# Patient Record
Sex: Male | Born: 1990 | Race: Black or African American | Hispanic: No | Marital: Single | Smoking: Former smoker
Health system: Southern US, Community
[De-identification: ages and names within clinical notes are randomized; demographics above are authoritative.]

## PROBLEM LIST (undated history)

## (undated) DIAGNOSIS — J45909 Unspecified asthma, uncomplicated: Secondary | ICD-10-CM

---
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
## 2012-11-22 ENCOUNTER — Encounter (HOSPITAL_BASED_OUTPATIENT_CLINIC_OR_DEPARTMENT_OTHER): Payer: Self-pay

## 2012-11-22 ENCOUNTER — Emergency Department (HOSPITAL_BASED_OUTPATIENT_CLINIC_OR_DEPARTMENT_OTHER)
Admission: EM | Admit: 2012-11-22 | Discharge: 2012-11-22 | Disposition: A | Discharge status: Home / Self Care | Payer: Managed Care, Other (non HMO) | Attending: Emergency Medicine | Admitting: Emergency Medicine

## 2012-11-22 DIAGNOSIS — L299 Pruritus, unspecified: Secondary | ICD-10-CM | POA: Insufficient documentation

## 2012-11-22 DIAGNOSIS — R209 Unspecified disturbances of skin sensation: Secondary | ICD-10-CM | POA: Insufficient documentation

## 2012-11-22 DIAGNOSIS — IMO0002 Reserved for concepts with insufficient information to code with codable children: Secondary | ICD-10-CM | POA: Insufficient documentation

## 2012-11-22 DIAGNOSIS — T7840XA Allergy, unspecified, initial encounter: Secondary | ICD-10-CM

## 2012-11-22 DIAGNOSIS — R21 Rash and other nonspecific skin eruption: Secondary | ICD-10-CM | POA: Insufficient documentation

## 2012-11-22 MED ORDER — PREDNISONE 20 MG PO TABS: 40.0000 mg | ORAL_TABLET | Freq: Every day | ORAL | Status: DC

## 2012-11-22 NOTE — ED Notes (Signed)
Pt reports lip swelling that started yesterday. Denies difficulty breathing or swallowing.

## 2012-11-22 NOTE — ED Provider Notes (Signed)
   History    CSN: [id number] Arrival date & time 11/22/12  1153  First MD Initiated Contact with Patient 11/22/12 1218     Chief Complaint  Patient presents with  . Facial Swelling   (Consider location/radiation/quality/duration/timing/severity/associated sxs/prior Treatment) HPI Comments: States does not feel like he came in contact with anything new but lips have been swollen and tingling the last 2 days.  No mouth or tongue involvement.  Mild itching and tingling on the face as well.  Only happened once in the past and unclear what caused it.  Improvement in lip swelling but still having reaction.  Patient is a 22 y.o. male presenting with rash. The history is provided by the patient.  Rash Pain location: lips and face. Pain quality comment:  Swelling of both lips and itching and itching in the face Associated symptoms: no shortness of breath    History reviewed. No pertinent past medical history. History reviewed. No pertinent past surgical history. No family history on file. History  Substance Use Topics  . Smoking status: Never Smoker   . Smokeless tobacco: Not on file  . Alcohol Use: No    Review of Systems  HENT: Negative for trouble swallowing.   Respiratory: Negative for shortness of breath and wheezing.   Skin: Positive for rash.  All other systems reviewed and are negative.    Allergies  Rubbing alcohol  Home Medications   Current Outpatient Rx  Name  Route  Sig  Dispense  Refill  . predniSONE (DELTASONE) 20 MG tablet   Oral   Take 2 tablets (40 mg total) by mouth daily.   10 tablet   0    BP 138/72  Pulse 64  Temp(Src) 98.4 F (36.9 C) (Oral)  Resp 18  Wt 230 lb (104.327 kg)  BMI 33 kg/m2  SpO2 100% Physical Exam  Nursing note and vitals reviewed. Constitutional: He is oriented to person, place, and time. He appears well-developed and well-nourished. No distress.  HENT:  Head: Normocephalic and atraumatic.  Mild swelling and irritation to  the bilateral lips.  Tongue and uvula is normal  Eyes: EOM are normal. Pupils are equal, round, and reactive to light.  Cardiovascular: Normal rate.   Pulmonary/Chest: Effort normal.  Neurological: He is alert and oriented to person, place, and time.  Skin: Skin is warm and dry. Rash noted. Rash is maculopapular.     Psychiatric: He has a normal mood and affect. His behavior is normal.    ED Course  Procedures (including critical care time) Labs Reviewed - No data to display No results found. 1. Allergic reaction, initial encounter     MDM    Pt with signs of allergic reaction to lips.  No intraoral swelling, no throat swelling and no SOB.  Pt given steroids  Gwyneth Sprout, MD 11/22/12 1319

## 2012-11-22 NOTE — ED Notes (Signed)
MD at bedside. 

## 2012-11-30 ENCOUNTER — Encounter (HOSPITAL_BASED_OUTPATIENT_CLINIC_OR_DEPARTMENT_OTHER): Payer: Self-pay | Admitting: Family Medicine

## 2012-11-30 ENCOUNTER — Emergency Department (HOSPITAL_BASED_OUTPATIENT_CLINIC_OR_DEPARTMENT_OTHER)
Admission: EM | Admit: 2012-11-30 | Discharge: 2012-11-30 | Disposition: A | Discharge status: Home / Self Care | Payer: Managed Care, Other (non HMO) | Attending: Emergency Medicine | Admitting: Emergency Medicine

## 2012-11-30 ENCOUNTER — Emergency Department (HOSPITAL_BASED_OUTPATIENT_CLINIC_OR_DEPARTMENT_OTHER): Payer: Managed Care, Other (non HMO)

## 2012-11-30 DIAGNOSIS — Y9367 Activity, basketball: Secondary | ICD-10-CM | POA: Insufficient documentation

## 2012-11-30 DIAGNOSIS — S022XXA Fracture of nasal bones, initial encounter for closed fracture: Secondary | ICD-10-CM

## 2012-11-30 DIAGNOSIS — W219XXA Striking against or struck by unspecified sports equipment, initial encounter: Secondary | ICD-10-CM | POA: Insufficient documentation

## 2012-11-30 DIAGNOSIS — Y9289 Other specified places as the place of occurrence of the external cause: Secondary | ICD-10-CM | POA: Insufficient documentation

## 2012-11-30 MED ORDER — HYDROCODONE-ACETAMINOPHEN 5-325 MG PO TABS: 2.0000 | ORAL_TABLET | ORAL | Status: DC | PRN

## 2012-11-30 NOTE — ED Provider Notes (Signed)
   History    CSN: [id number] Arrival date & time 11/30/12  1436  First MD Initiated Contact with Patient 11/30/12 1448     Chief Complaint  Patient presents with  . Facial Injury   (Consider location/radiation/quality/duration/timing/severity/associated sxs/prior Treatment) HPI Comments: Pt states that he was elbowed while playing basketball:pt states that his nose was bleeding  Patient is a 22 y.o. male presenting with facial injury. The history is provided by the patient. No language interpreter was used.  Facial Injury Mechanism of injury:  Direct blow Location:  Nose Pain details:    Quality:  Aching   Severity:  Mild   Timing:  Constant   Progression:  Unchanged  History reviewed. No pertinent past medical history. History reviewed. No pertinent past surgical history. No family history on file. History  Substance Use Topics  . Smoking status: Never Smoker   . Smokeless tobacco: Not on file  . Alcohol Use: No    Review of Systems  Constitutional: Negative.   Respiratory: Negative.   Cardiovascular: Negative.     Allergies  Rubbing alcohol  Home Medications   Current Outpatient Rx  Name  Route  Sig  Dispense  Refill  . HYDROcodone-acetaminophen (NORCO/VICODIN) 5-325 MG per tablet   Oral   Take 2 tablets by mouth every 4 (four) hours as needed for pain.   10 tablet   0   . predniSONE (DELTASONE) 20 MG tablet   Oral   Take 2 tablets (40 mg total) by mouth daily.   10 tablet   0    BP 154/74  Pulse 62  Temp(Src) 98 F (36.7 C) (Oral)  Resp 16  SpO2 99% Physical Exam  Nursing note and vitals reviewed. Constitutional: He appears well-developed and well-nourished.  HENT:  Right Ear: External ear normal.  Left Ear: External ear normal.  Swelling noted over the bridge of the nose:dried blood noted in nares bilaterally  Eyes: Conjunctivae and EOM are normal. Pupils are equal, round, and reactive to light.  Cardiovascular: Normal rate and regular  rhythm.   Pulmonary/Chest: Effort normal and breath sounds normal.  Musculoskeletal: Normal range of motion.    ED Course  Procedures (including critical care time) Labs Reviewed - No data to display Dg Nasal Bones  11/30/2012   *RADIOLOGY REPORT*  Clinical Data: Hit in the nose with elbow, pain and bleeding  NASAL BONES - 3+ VIEW  Comparison: None.  Findings: There is a minimally displaced fracture of the tip of the distal nasal bone.  No other acute bony abnormality is seen.  The paranasal sinuses are clear.  The nasal spine is intact.  IMPRESSION: Minimally displaced fracture of the tip of the nasal bone.   Original Report Authenticated By: Dwyane Dee, M.D.   1. Nasal fracture, closed, initial encounter     MDM  Pt given vicodin and referral to ent  Teressa Lower, NP 11/30/12 1532

## 2012-11-30 NOTE — ED Notes (Signed)
Pt sts he got hit in nose with elbow while playing basketball. Dried blood on nares. Denies loc

## 2012-11-30 NOTE — ED Notes (Signed)
Patient transported to X-ray 

## 2012-12-01 NOTE — ED Provider Notes (Signed)
Medical screening examination/treatment/procedure(s) were performed by non-physician practitioner and as supervising physician I was immediately available for consultation/collaboration.   Kapono Luhn B. Saria Haran, MD 12/01/12 0832 

## 2017-06-20 ENCOUNTER — Other Ambulatory Visit: Payer: Self-pay

## 2017-06-20 ENCOUNTER — Emergency Department (HOSPITAL_BASED_OUTPATIENT_CLINIC_OR_DEPARTMENT_OTHER)
Admission: EM | Admit: 2017-06-20 | Discharge: 2017-06-20 | Disposition: A | Discharge status: Home / Self Care | Payer: BLUE CROSS/BLUE SHIELD | Attending: Emergency Medicine | Admitting: Emergency Medicine

## 2017-06-20 ENCOUNTER — Encounter (HOSPITAL_BASED_OUTPATIENT_CLINIC_OR_DEPARTMENT_OTHER): Payer: Self-pay | Admitting: *Deleted

## 2017-06-20 DIAGNOSIS — L03011 Cellulitis of right finger: Secondary | ICD-10-CM | POA: Diagnosis not present

## 2017-06-20 DIAGNOSIS — Y9389 Activity, other specified: Secondary | ICD-10-CM | POA: Insufficient documentation

## 2017-06-20 DIAGNOSIS — F17210 Nicotine dependence, cigarettes, uncomplicated: Secondary | ICD-10-CM | POA: Insufficient documentation

## 2017-06-20 DIAGNOSIS — S60561A Insect bite (nonvenomous) of right hand, initial encounter: Secondary | ICD-10-CM | POA: Insufficient documentation

## 2017-06-20 DIAGNOSIS — Y929 Unspecified place or not applicable: Secondary | ICD-10-CM | POA: Insufficient documentation

## 2017-06-20 DIAGNOSIS — Y999 Unspecified external cause status: Secondary | ICD-10-CM | POA: Diagnosis not present

## 2017-06-20 DIAGNOSIS — Z79899 Other long term (current) drug therapy: Secondary | ICD-10-CM | POA: Diagnosis not present

## 2017-06-20 DIAGNOSIS — W57XXXA Bitten or stung by nonvenomous insect and other nonvenomous arthropods, initial encounter: Secondary | ICD-10-CM | POA: Diagnosis not present

## 2017-06-20 DIAGNOSIS — R2231 Localized swelling, mass and lump, right upper limb: Secondary | ICD-10-CM | POA: Diagnosis present

## 2017-06-20 MED ORDER — DOXYCYCLINE HYCLATE 100 MG PO TABS
100.0000 mg | ORAL_TABLET | Freq: Once | ORAL | Status: AC
  Administered 2017-06-20: 100 mg via ORAL
  Filled 2017-06-20: qty 1

## 2017-06-20 MED ORDER — DOXYCYCLINE HYCLATE 100 MG PO CAPS: 100.0000 mg | ORAL_CAPSULE | Freq: Two times a day (BID) | ORAL | 0 refills | Status: DC

## 2017-06-20 NOTE — ED Provider Notes (Signed)
[facility] EMERGENCY DEPARTMENT Provider Note   CSN: [id number] Arrival date & time: 06/20/17  0405     History   Chief Complaint Chief Complaint  Patient presents with  . Insect Bite    HPI Alex Herring is a 27 y.o. male.  HPI  This is a 27 year old male who presents with concerns for insect bites.  Patient reports 2-3-day history of noting multiple "insect bites" on his neck, hand, and bilateral legs.  Initially he noted itching but then noted increasing redness and swelling as well as pain to the right hand.  He states that he tried to lance the bite open of the right hand and has noted some drainage.  He denies any fevers.  Unsure of what he may have been bitten by.  Denies any known exposures.  No one in his house has similar symptoms.  He did take Benadryl prior to arrival.  History reviewed. No pertinent past medical history.  There are no active problems to display for this patient.   History reviewed. No pertinent surgical history.     Home Medications    Prior to Admission medications   Medication Sig Start Date End Date Taking? Authorizing Provider  doxycycline (VIBRAMYCIN) 100 MG capsule Take 1 capsule (100 mg total) by mouth 2 (two) times daily. 06/20/17   Veronia Laprise, Mayer Maskerourtney F, MD  HYDROcodone-acetaminophen (NORCO/VICODIN) 5-325 MG per tablet Take 2 tablets by mouth every 4 (four) hours as needed for pain. 11/30/12   Teressa LowerPickering, Vrinda, NP  predniSONE (DELTASONE) 20 MG tablet Take 2 tablets (40 mg total) by mouth daily. 11/22/12   Gwyneth SproutPlunkett, Whitney, MD    Family History History reviewed. No pertinent family history.  Social History Social History   Tobacco Use  . Smoking status: Current Some Day Smoker  . Smokeless tobacco: Never Used  Substance Use Topics  . Alcohol use: Yes  . Drug use: No     Allergies   Rubbing alcohol [alcohol]   Review of Systems Review of Systems  Constitutional: Negative for fever.  Skin: Positive for color  change and wound.  All other systems reviewed and are negative.    Physical Exam Updated Vital Signs BP 131/70 (BP Location: Left Arm)   Pulse 60   Temp 98.1 F (36.7 C)   Resp 16   Ht 5\' 11"  (1.803 m)   Wt 104.3 kg (230 lb)   SpO2 100%   BMI 32.08 kg/m   Physical Exam  Constitutional: He is oriented to person, place, and time. He appears well-developed and well-nourished. No distress.  HENT:  Head: Normocephalic and atraumatic.  Cardiovascular: Normal rate, regular rhythm and normal heart sounds.  Pulmonary/Chest: Effort normal and breath sounds normal. No respiratory distress. He has no wheezes.  Musculoskeletal: He exhibits no edema.  Mild swelling noted of the dorsum of the right hand along the ulnar aspect, normal range of motion of the wrist, flexion and extension of the fingers, 2+ radial pulse  Neurological: He is alert and oriented to person, place, and time.  Skin: Skin is warm and dry.  2 discrete scabbed areas over the back of the neck with overlying excoriation, no significant erythema, no fluctuance Lesion noted over the fifth MCP of the right hand with associated swelling and streaking erythema proximally, no spontaneous drainage noted Less than 1 cm blister noted to the medial aspect of the left ankle with mild erythema  Psychiatric: He has a normal mood and affect.  Nursing note and vitals  reviewed.        ED Treatments / Results  Labs (all labs ordered are listed, but only abnormal results are displayed) Labs Reviewed - No data to display  EKG  EKG Interpretation None       Radiology No results found.  Procedures Procedures (including critical care time)  Medications Ordered in ED Medications  doxycycline (VIBRA-TABS) tablet 100 mg (not administered)     Initial Impression / Assessment and Plan / ED Course  I have reviewed the triage vital signs and the nursing notes.  Pertinent labs & imaging results that were available during my  care of the patient were reviewed by me and considered in my medical decision making (see chart for details).     Patient presents with multiple discrete lesions concerning for insect bites.  He has evidence of cellulitis and streaking of the right hand.  Likely related to lancing of that particular lesion.  But he is clinically well-appearing.  No fevers.  No evidence of deep space infection and normal range of motion of the fingers and wrists.  Will treat with doxycycline to cover for MRSA.  Local wound management with the other bites.  Patient was given strict return precautions including increasing redness, fevers, increasing swelling.  After history, exam, and medical workup I feel the patient has been appropriately medically screened and is safe for discharge home. Pertinent diagnoses were discussed with the patient. Patient was given return precautions.   Final Clinical Impressions(s) / ED Diagnoses   Final diagnoses:  Insect bite, initial encounter  Cellulitis of finger of right hand    ED Discharge Orders        Ordered    doxycycline (VIBRAMYCIN) 100 MG capsule  2 times daily     06/20/17 0450       Valen Gillison, Mayer Masker, MD 06/20/17 [phone]

## 2017-06-20 NOTE — ED Notes (Addendum)
Alert, NAD, calm, interactive, resps e/u, speaking in clear complete sentences, no dyspnea noted, skin W&D, VSS, here for "insect bites" to R hand, L ankle and L posterior neck with: redness and swelling, (and blister noted to L ankle), admits to pain and itching, admits to lancing R hand, took benadryl PTA, no topicals applied, (denies: sob, NVD, diarrhea, cramping, fever, dizziness or visual changes). EDP into room.

## 2017-06-20 NOTE — Discharge Instructions (Signed)
You were seen today for possible insect bites and increasing redness of the right hand.  You have an infection of the skin of the right hand.  Take antibiotics as directed.  If you notice increasing redness, swelling, difficulty with range of motion of the fingers, you need to be reevaluated immediately.  Regarding her other bites, wound care with bacitracin is recommended.  Try not to scratch.

## 2019-09-21 ENCOUNTER — Ambulatory Visit: Payer: Self-pay | Attending: Family

## 2019-09-21 DIAGNOSIS — Z23 Encounter for immunization: Secondary | ICD-10-CM

## 2019-09-21 NOTE — Progress Notes (Signed)
   Covid-19 Vaccination Clinic  Name:  Alex Herring    MRN: [id number] DOB: 12-06-1990  09/21/2019  Alex Herring was observed post Covid-19 immunization for 15 minutes without incident. He was provided with Vaccine Information Sheet and instruction to access the V-Safe system.   Alex Herring was instructed to call 911 with any severe reactions post vaccine: Marland Kitchen Difficulty breathing  . Swelling of face and throat  . A fast heartbeat  . A bad rash all over body  . Dizziness and weakness   Immunizations Administered    Name Date Dose VIS Date Route   Moderna COVID-19 Vaccine 09/21/2019  1:24 PM 0.5 mL 05/2019 Intramuscular   Manufacturer: Moderna   Lot: [id number]   NDC: 44695-072-25

## 2019-10-17 ENCOUNTER — Ambulatory Visit: Payer: Self-pay | Attending: Family

## 2019-10-17 DIAGNOSIS — Z23 Encounter for immunization: Secondary | ICD-10-CM

## 2019-10-17 NOTE — Progress Notes (Signed)
   Covid-19 Vaccination Clinic  Name:  Alex Herring    MRN: [id number] DOB: 1990-11-03  10/17/2019  Mr. Colter was observed post Covid-19 immunization for 15 minutes without incident. He was provided with Vaccine Information Sheet and instruction to access the V-Safe system.   Mr. Dorko was instructed to call 911 with any severe reactions post vaccine: Marland Kitchen Difficulty breathing  . Swelling of face and throat  . A fast heartbeat  . A bad rash all over body  . Dizziness and weakness   Immunizations Administered    Name Date Dose VIS Date Route   Moderna COVID-19 Vaccine 10/17/2019  1:14 PM 0.5 mL 05/2019 Intramuscular   Manufacturer: Moderna   Lot: [id number]   NDC: 29937-169-67

## 2019-11-16 ENCOUNTER — Other Ambulatory Visit: Payer: Self-pay

## 2019-11-16 ENCOUNTER — Encounter (HOSPITAL_BASED_OUTPATIENT_CLINIC_OR_DEPARTMENT_OTHER): Payer: Self-pay | Admitting: *Deleted

## 2019-11-16 ENCOUNTER — Emergency Department (HOSPITAL_BASED_OUTPATIENT_CLINIC_OR_DEPARTMENT_OTHER)
Admission: EM | Admit: 2019-11-16 | Discharge: 2019-11-16 | Disposition: A | Discharge status: Home / Self Care | Payer: 59 | Attending: Emergency Medicine | Admitting: Emergency Medicine

## 2019-11-16 DIAGNOSIS — J029 Acute pharyngitis, unspecified: Secondary | ICD-10-CM | POA: Insufficient documentation

## 2019-11-16 DIAGNOSIS — J069 Acute upper respiratory infection, unspecified: Secondary | ICD-10-CM

## 2019-11-16 DIAGNOSIS — Z87891 Personal history of nicotine dependence: Secondary | ICD-10-CM | POA: Diagnosis not present

## 2019-11-16 DIAGNOSIS — J45909 Unspecified asthma, uncomplicated: Secondary | ICD-10-CM | POA: Insufficient documentation

## 2019-11-16 DIAGNOSIS — Z20822 Contact with and (suspected) exposure to covid-19: Secondary | ICD-10-CM | POA: Diagnosis not present

## 2019-11-16 DIAGNOSIS — R070 Pain in throat: Secondary | ICD-10-CM | POA: Diagnosis present

## 2019-11-16 HISTORY — DX: Unspecified asthma, uncomplicated: J45.909

## 2019-11-16 LAB — SARS CORONAVIRUS 2 BY RT PCR (HOSPITAL ORDER, PERFORMED IN ~~LOC~~ HOSPITAL LAB): SARS Coronavirus 2: NEGATIVE

## 2019-11-16 LAB — GROUP A STREP BY PCR: Group A Strep by PCR: NOT DETECTED

## 2019-11-16 NOTE — ED Provider Notes (Signed)
[facility] EMERGENCY DEPARTMENT Provider Note   CSN: [id number] Arrival date & time: 11/16/19  3151     History Chief Complaint  Patient presents with  . Sore Throat    Alex Herring is a 29 y.o. male.  HPI  Patient is 29 year old male with a history of asthma presented today with symptoms of sore throat that is achy, sometimes sharp, began yesterday evening and states that it was worse when he woke up this morning.  He denies any difficulty swallowing or breathing.  He denies any nausea, vomiting fevers or chills.  He states he has had a mild cough and also has some nasal congestion.  No known sick contacts.  He has taken nothing for his symptoms prior to arrival.  No aggravating or mitigating factors.     Past Medical History:  Diagnosis Date  . Asthma     There are no problems to display for this patient.   History reviewed. No pertinent surgical history.     No family history on file.  Social History   Tobacco Use  . Smoking status: Former Games developer  . Smokeless tobacco: Never Used  Substance Use Topics  . Alcohol use: Yes    Comment: occasionally, beer & liquor  . Drug use: No    Home Medications Prior to Admission medications   Not on File    Allergies    Rubbing alcohol [alcohol]  Review of Systems   Review of Systems  Constitutional: Positive for fatigue. Negative for chills and fever.  HENT: Positive for congestion and sore throat.   Eyes: Negative for pain.  Respiratory: Positive for cough. Negative for shortness of breath.   Cardiovascular: Negative for chest pain and leg swelling.  Gastrointestinal: Negative for abdominal pain and vomiting.  Genitourinary: Negative for dysuria.  Musculoskeletal: Negative for myalgias.  Skin: Negative for rash.  Neurological: Negative for dizziness and headaches.    Physical Exam Updated Vital Signs BP (!) 148/74 (BP Location: Right Arm)   Pulse (!) 51   Temp 97.8 F (36.6 C) (Oral)   Resp 16    Ht 5\' 10"  (1.778 m)   Wt 99.8 kg   SpO2 99%   BMI 31.57 kg/m   Physical Exam Vitals and nursing note reviewed.  Constitutional:      General: He is not in acute distress. HENT:     Head: Normocephalic and atraumatic.     Nose: Nose normal.     Comments: No significant rhinorrhea.  Nasal mucosa somewhat boggy.    Mouth/Throat:     Mouth: Mucous membranes are moist.     Tonsils: No tonsillar exudate or tonsillar abscesses. 0 on the right. 0 on the left.     Comments: Posterior pharynx is clear with some clear postnasal drainage Eyes:     General: No scleral icterus.    Conjunctiva/sclera: Conjunctivae normal.     Pupils: Pupils are equal, round, and reactive to light.  Cardiovascular:     Rate and Rhythm: Normal rate and regular rhythm.     Pulses: Normal pulses.     Heart sounds: Normal heart sounds.  Pulmonary:     Effort: Pulmonary effort is normal. No respiratory distress.     Breath sounds: No wheezing.  Abdominal:     Palpations: Abdomen is soft.     Tenderness: There is no abdominal tenderness.  Musculoskeletal:     Cervical back: Normal range of motion.     Right lower leg: No edema.  Left lower leg: No edema.  Skin:    General: Skin is warm and dry.     Capillary Refill: Capillary refill takes less than 2 seconds.  Neurological:     Mental Status: He is alert. Mental status is at baseline.  Psychiatric:        Mood and Affect: Mood normal.        Behavior: Behavior normal.     ED Results / Procedures / Treatments   Labs (all labs ordered are listed, but only abnormal results are displayed) Labs Reviewed  GROUP A STREP BY PCR  SARS CORONAVIRUS 2 BY RT PCR (HOSPITAL ORDER, [facility])    EKG None  Radiology No results found.  Procedures Procedures (including critical care time)  Medications Ordered in ED Medications - No data to display  ED Course  I have reviewed the triage vital signs and the nursing  notes.  Pertinent labs & imaging results that were available during my care of the patient were reviewed by me and considered in my medical decision making (see chart for details).    MDM Rules/Calculators/A&P                           Patient is a well-appearing healthy 29 year old male with chief complaint today of sore throat.  He has associated symptoms of mild cough and fatigue.  Denies any muscle aches, nausea, vomiting, fevers, lightheadedness or dizziness.  He states he is otherwise feeling well.  Denies any shortness of breath or chest pain.  Physical exam is unremarkable.  He has well-appearing posterior pharynx and no lymphadenopathy noted on exam.  Low suspicion for strep versus mono.  Most likely viral URI.   Strep test is negative.  Patient requested Covid testing which was obtained.  He will be notified if test is positive.  Rapid strep test was negative.  Patient given supportive care and Tylenol, ibuprofen, other over-the-counter medication dosage and timing advice.   Alex Herring was evaluated in Emergency Department on 11/16/2019 for the symptoms described in the history of present illness. He was evaluated in the context of the global COVID-19 pandemic, which necessitated consideration that the patient might be at risk for infection with the SARS-CoV-2 virus that causes COVID-19. Institutional protocols and algorithms that pertain to the evaluation of patients at risk for COVID-19 are in a state of rapid change based on information released by regulatory bodies including the CDC and federal and state organizations. These policies and algorithms were followed during the patient's care in the ED.  Final Clinical Impression(s) / ED Diagnoses Final diagnoses:  Viral pharyngitis  Viral upper respiratory tract infection    Rx / DC Orders ED Discharge Orders    None       Tedd Sias, [place] 11/16/19 1050    Gareth Morgan, MD 11/17/19 2204

## 2019-11-16 NOTE — ED Triage Notes (Signed)
Sore throat started this morning when he woke up.

## 2019-11-16 NOTE — Discharge Instructions (Signed)
Viral Illness TREATMENT  Treatment is directed at relieving symptoms. There is no cure. Antibiotics are not effective, because the infection is caused by a virus, not by bacteria. Treatment may include:  Increased fluid intake. Sports drinks offer valuable electrolytes, sugars, and fluids.  Breathing heated mist or steam (vaporizer or shower).  Eating chicken soup or other clear broths, and maintaining good nutrition.  Getting plenty of rest.  Using gargles or lozenges for comfort.  Increasing usage of your inhaler if you have asthma.  Return to work when your temperature has returned to normal.  Gargle warm salt water and spit it out for sore throat. Take benadryl to decrease sinus secretions. Continue to alternate between Tylenol and ibuprofen for pain and fever control.  Follow Up: Follow up with your primary care doctor in 5-7 days for recheck of ongoing symptoms.  Return to emergency department for emergent changing or worsening of symptoms.    Please use Tylenol or ibuprofen for pain.  You may use 600 mg ibuprofen every 6 hours or 1000 mg of Tylenol every 6 hours.  You may choose to alternate between the 2.  This would be most effective.  Not to exceed 4 g of Tylenol within 24 hours.  Not to exceed 3200 mg ibuprofen 24 hours.  

## 2019-11-26 ENCOUNTER — Other Ambulatory Visit: Payer: Self-pay

## 2019-11-26 ENCOUNTER — Encounter (HOSPITAL_BASED_OUTPATIENT_CLINIC_OR_DEPARTMENT_OTHER): Payer: Self-pay | Admitting: Emergency Medicine

## 2019-11-26 ENCOUNTER — Emergency Department (HOSPITAL_BASED_OUTPATIENT_CLINIC_OR_DEPARTMENT_OTHER): Payer: 59

## 2019-11-26 ENCOUNTER — Emergency Department (HOSPITAL_BASED_OUTPATIENT_CLINIC_OR_DEPARTMENT_OTHER)
Admission: EM | Admit: 2019-11-26 | Discharge: 2019-11-26 | Disposition: A | Discharge status: Home / Self Care | Payer: 59 | Attending: Emergency Medicine | Admitting: Emergency Medicine

## 2019-11-26 DIAGNOSIS — Y929 Unspecified place or not applicable: Secondary | ICD-10-CM | POA: Insufficient documentation

## 2019-11-26 DIAGNOSIS — W1839XA Other fall on same level, initial encounter: Secondary | ICD-10-CM | POA: Insufficient documentation

## 2019-11-26 DIAGNOSIS — Y9367 Activity, basketball: Secondary | ICD-10-CM | POA: Diagnosis not present

## 2019-11-26 DIAGNOSIS — M25562 Pain in left knee: Secondary | ICD-10-CM | POA: Diagnosis present

## 2019-11-26 DIAGNOSIS — Y999 Unspecified external cause status: Secondary | ICD-10-CM | POA: Insufficient documentation

## 2019-11-26 DIAGNOSIS — J45909 Unspecified asthma, uncomplicated: Secondary | ICD-10-CM | POA: Diagnosis not present

## 2019-11-26 DIAGNOSIS — Z87891 Personal history of nicotine dependence: Secondary | ICD-10-CM | POA: Diagnosis not present

## 2019-11-26 MED ORDER — IBUPROFEN 800 MG PO TABS
800.0000 mg | ORAL_TABLET | Freq: Once | ORAL | Status: AC
Start: 2019-11-26 — End: 2019-11-26
  Administered 2019-11-26: 800 mg via ORAL
  Filled 2019-11-26: qty 1

## 2019-11-26 MED ORDER — NAPROXEN 500 MG PO TABS: 500.0000 mg | ORAL_TABLET | Freq: Two times a day (BID) | ORAL | 0 refills | Status: AC

## 2019-11-26 NOTE — Discharge Instructions (Signed)
Take naproxen 2 times a day with meals.  Do not take other anti-inflammatories at the same time (Advil, Motrin, ibuprofen, Aleve). You may supplement with Tylenol if you need further pain control. Use ice packs , 20 minutes at a time, 2-3 times a day. Keep your leg elevated and rested when you are able to. If your pain is not improving within the next week, follow-up with the sports medicine doctor listed below for further evaluation. Return to emergency room if you develop fevers, inability to move your leg, numbness of your foot, color change of your foot, or any new, worsening, or concerning symptoms.

## 2019-11-26 NOTE — ED Triage Notes (Signed)
L knee pain and swelling. Injured it yesterday playing basketball.

## 2019-11-26 NOTE — ED Provider Notes (Signed)
[facility] EMERGENCY DEPARTMENT Provider Note   CSN: [id number] Arrival date & time: 11/26/19  0086     History Chief Complaint  Patient presents with  . Knee Pain    Alex Herring is a 29 y.o. male presenting for evaluation of left knee pain.  Patient states he was playing basketball yesterday when he came down from a jump, and had some mild pain in his left knee.  No twisting motion, buckling, or trauma at the time.  It gradually worsened as he continued to play basketball and as the day progressed.  When he woke this morning, the pain was worse.  It is constant, worse with weightbearing.  He reports associated swelling/tightness.  At rest, he has some mild soreness, but no sharp pain.  He denies numbness or tingling.  He has not taken anything for pain including Tylenol ibuprofen. Pain does not radiate.  He denies numbness or tingling.  He denies previous injury to this knee.  He has no other medical problems, takes medications daily.  He denies pain elsewhere.  HPI     Past Medical History:  Diagnosis Date  . Asthma     There are no problems to display for this patient.   History reviewed. No pertinent surgical history.     No family history on file.  Social History   Tobacco Use  . Smoking status: Former Games developer  . Smokeless tobacco: Never Used  Substance Use Topics  . Alcohol use: Yes    Comment: occasionally, beer & liquor  . Drug use: No    Home Medications Prior to Admission medications   Medication Sig Start Date End Date Taking? Authorizing Provider  naproxen (NAPROSYN) 500 MG tablet Take 1 tablet (500 mg total) by mouth 2 (two) times daily with a meal for 10 days. 11/26/19 12/06/19  Kamaiyah Uselton, PA-C    Allergies    Rubbing alcohol [alcohol]  Review of Systems   Review of Systems  Musculoskeletal: Positive for arthralgias and joint swelling.  Neurological: Negative for numbness.    Physical Exam Updated Vital Signs BP 139/86 (BP  Location: Right Arm)   Pulse 60   Temp 98.2 F (36.8 C) (Oral)   Resp 16   Ht 5\' 11"  (1.803 m)   Wt 99.8 kg   SpO2 96%   BMI 30.68 kg/m   Physical Exam Vitals and nursing note reviewed.  Constitutional:      General: He is not in acute distress.    Appearance: He is well-developed.  HENT:     Head: Normocephalic and atraumatic.  Pulmonary:     Effort: Pulmonary effort is normal.  Abdominal:     General: There is no distension.  Musculoskeletal:        General: Swelling and tenderness present. Normal range of motion.     Cervical back: Normal range of motion.     Comments: Mild swelling to the anterolateral left knee.  Tenderness palpation of the lateral knee.  No chest palpation of the medial or posterior knee.  No tenderness palpation of the calf or lower leg.  Pedal pulses 2+ bilaterally.  Good distal sensation.  Full active range of motion of the ankle and hip without pain.  Increased pain with flexion and extension of the knee.  Patient is ambulatory with a limp.  No erythema or warmth of the knee.  Skin:    General: Skin is warm.     Capillary Refill: Capillary refill takes less than 2  seconds.     Findings: No rash.  Neurological:     Mental Status: He is alert and oriented to person, place, and time.     ED Results / Procedures / Treatments   Labs (all labs ordered are listed, but only abnormal results are displayed) Labs Reviewed - No data to display  EKG None  Radiology DG Knee Complete 4 Views Left  Result Date: 11/26/2019 CLINICAL DATA:  Left knee pain following an injury yesterday. EXAM: LEFT KNEE - COMPLETE 4+ VIEW COMPARISON:  None. FINDINGS: No evidence of fracture, dislocation, or joint effusion. No evidence of arthropathy or other focal bone abnormality. Soft tissues are unremarkable. IMPRESSION: Normal examination. Electronically Signed   By: Claudie Revering M.D.   On: 11/26/2019 10:31    Procedures Procedures (including critical care  time)  Medications Ordered in ED Medications  ibuprofen (ADVIL) tablet 800 mg (800 mg Oral Given 11/26/19 1046)    ED Course  I have reviewed the triage vital signs and the nursing notes.  Pertinent labs & imaging results that were available during my care of the patient were reviewed by me and considered in my medical decision making (see chart for details).    MDM Rules/Calculators/A&P                          Patient presenting for evaluation of left knee pain.  On exam, patient appears nontoxic.  He is neurovascularly intact.  Exam is not consistent with septic joint.  As it occurred during basketball, likely strain/sprain.  However as he has worsened pain today, with pain x-rays to ensure no fracture dislocation.  X-rays viewed interpreted by me, no fracture dislocation.  Discussed findings with patient.  Discussed likely brain/strain.  Discussed antibiotic treatment Tylenol, ibuprofen, rest, ice, elevation.  Will give a knee sleeve to help with pain control.  Encourage follow-up with sports medicine in 1 week if symptoms not proving.  At this time, patient appears safe for discharge.  Return precautions given.  Patient states he understands and agrees to plan.  Final Clinical Impression(s) / ED Diagnoses Final diagnoses:  Acute pain of left knee    Rx / DC Orders ED Discharge Orders         Ordered    naproxen (NAPROSYN) 500 MG tablet  2 times daily with meals     Discontinue  Reprint     11/26/19 [place], Ayriana Wix, PA-C 11/26/19 [place], [employer], DO 11/26/19 1123

## 2020-04-29 ENCOUNTER — Emergency Department (HOSPITAL_BASED_OUTPATIENT_CLINIC_OR_DEPARTMENT_OTHER)
Admission: EM | Admit: 2020-04-29 | Discharge: 2020-04-29 | Disposition: A | Discharge status: Home / Self Care | Payer: 59 | Attending: Emergency Medicine | Admitting: Emergency Medicine

## 2020-04-29 ENCOUNTER — Other Ambulatory Visit: Payer: Self-pay

## 2020-04-29 ENCOUNTER — Encounter (HOSPITAL_BASED_OUTPATIENT_CLINIC_OR_DEPARTMENT_OTHER): Payer: Self-pay | Admitting: *Deleted

## 2020-04-29 DIAGNOSIS — J45909 Unspecified asthma, uncomplicated: Secondary | ICD-10-CM | POA: Insufficient documentation

## 2020-04-29 DIAGNOSIS — S70362A Insect bite (nonvenomous), left thigh, initial encounter: Secondary | ICD-10-CM | POA: Insufficient documentation

## 2020-04-29 DIAGNOSIS — W57XXXA Bitten or stung by nonvenomous insect and other nonvenomous arthropods, initial encounter: Secondary | ICD-10-CM | POA: Insufficient documentation

## 2020-04-29 DIAGNOSIS — Z87891 Personal history of nicotine dependence: Secondary | ICD-10-CM | POA: Insufficient documentation

## 2020-04-29 DIAGNOSIS — S40862A Insect bite (nonvenomous) of left upper arm, initial encounter: Secondary | ICD-10-CM | POA: Insufficient documentation

## 2020-04-29 MED ORDER — CEPHALEXIN 250 MG PO CAPS: 500.0000 mg | ORAL_CAPSULE | Freq: Two times a day (BID) | ORAL | 0 refills | Status: AC

## 2020-04-29 NOTE — ED Triage Notes (Signed)
Patient had some insect bite on his side for a week.  Pt claimed that it itches.

## 2020-04-29 NOTE — Discharge Instructions (Addendum)
Please continue to take Benadryl for itching, over-the-counter hydrocortisone cream and antibacterial cream.  I have also prescribed an antibiotic, please take this as directed until finished.  Please call [facility] to establish with a primary care doctor.  Return to the ER for any new or worsening symptoms.

## 2020-04-29 NOTE — ED Provider Notes (Signed)
[facility] EMERGENCY DEPARTMENT Provider Note   CSN: [id number] Arrival date & time: 04/29/20  0840     History Chief Complaint  Patient presents with  . Insect bite    Alex Herring is a 29 y.o. male.  HPI 29 year old male with a history of asthma presents to the ER with complaints of insect bites to his left arm, left side and left thigh.  States that this been ongoing for about a week.  He has been taking Benadryl and hydrocortisone cream as the bites are itchy.  States that he does not spend an excessive amount outside, states that he does have dogs though.  Unaware if the dog's have fleas.  He presents wanting to be evaluated and told what has been biting him.  He denies any fevers or chills.  No noticeable abscesses or drainages.    Past Medical History:  Diagnosis Date  . Asthma     There are no problems to display for this patient.   History reviewed. No pertinent surgical history.     History reviewed. No pertinent family history.  Social History   Tobacco Use  . Smoking status: Former Games developer  . Smokeless tobacco: Never Used  Substance Use Topics  . Alcohol use: Yes    Comment: occasionally, beer & liquor  . Drug use: No    Home Medications Prior to Admission medications   Medication Sig Start Date End Date Taking? Authorizing Provider  cephALEXin (KEFLEX) 250 MG capsule Take 2 capsules (500 mg total) by mouth 2 (two) times daily for 7 days. 04/29/20 05/06/20  Mare Ferrari, PA-C    Allergies    Rubbing alcohol [alcohol]  Review of Systems   Review of Systems  Constitutional: Negative for chills and fever.  Skin: Positive for color change.    Physical Exam Updated Vital Signs BP 132/90 (BP Location: Right Arm)   Pulse 80   Temp 98.1 F (36.7 C) (Oral)   Resp 16   Ht 5\' 11"  (1.803 m)   Wt 104.3 kg   SpO2 100%   BMI 32.08 kg/m   Physical Exam Vitals reviewed.  Constitutional:      Appearance: Normal appearance.  HENT:      Head: Normocephalic and atraumatic.  Eyes:     General:        Right eye: No discharge.        Left eye: No discharge.     Extraocular Movements: Extraocular movements intact.     Conjunctiva/sclera: Conjunctivae normal.  Musculoskeletal:        General: No swelling. Normal range of motion.  Skin:    Comments: Scattered small insect bites to the dorsal aspect of the left arm, left rib cage and left thigh.  Some warmth was appreciated, no fluctuance or surrounding induration to the right.  Mild edema noted.  No excessive erythema.  Neurological:     General: No focal deficit present.     Mental Status: He is alert and oriented to person, place, and time.  Psychiatric:        Mood and Affect: Mood normal.        Behavior: Behavior normal.     ED Results / Procedures / Treatments   Labs (all labs ordered are listed, but only abnormal results are displayed) Labs Reviewed - No data to display  EKG None  Radiology No results found.  Procedures Procedures (including critical care time)  Medications Ordered in ED Medications - No data  to display  ED Course  I have reviewed the triage vital signs and the nursing notes.  Pertinent labs & imaging results that were available during my care of the patient were reviewed by me and considered in my medical decision making (see chart for details).    MDM Rules/Calculators/A&P                          29 year old male requesting to be evaluated for insect bites.  Wanting to know what is been biting him.  I explained to the patient that it is unclear exactly what kind of insect bite it is, though he should evaluate his dogs to see if they have any fleas.  He does have some warmth and edema to the area, will send in Keflex to prevent further soft tissue infection.  No evidence of anaphylactic reaction.  No visible abscesses or indications for I&D.  Return precautions discussed.  Patient is uninsured and does not have a PCP, will refer to  [facility].  He voiced understanding and is agreeable.  At this stage in the ED course, the patient is medically screened and stable for discharge. Final Clinical Impression(s) / ED Diagnoses Final diagnoses:  Insect bite, unspecified site, initial encounter    Rx / DC Orders ED Discharge Orders         Ordered    cephALEXin (KEFLEX) 250 MG capsule  2 times daily        04/29/20 1219           Leone Brand 04/29/20 1220    Vanetta Mulders, MD 04/30/20 [phone]

## 2021-07-20 IMAGING — DX DG KNEE COMPLETE 4+V*L*
4 series · 4 of 4 positions shown · non-contrast
Comparison: None.

CLINICAL DATA: Left knee pain following an injury yesterday.

EXAM:
LEFT KNEE - COMPLETE 4+ VIEW

[knee ap]
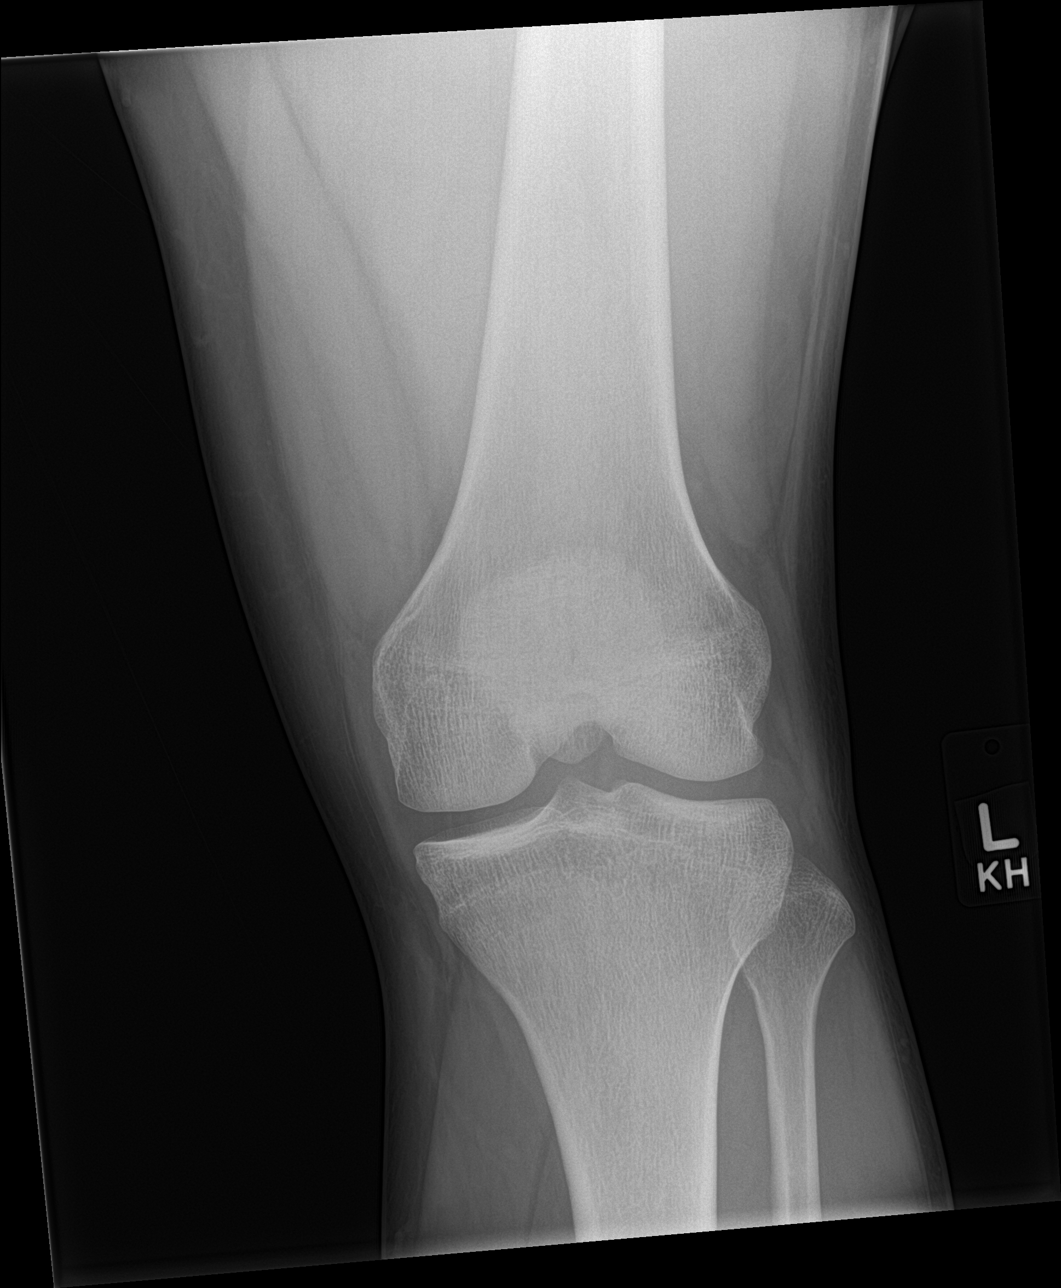

[knee lat]
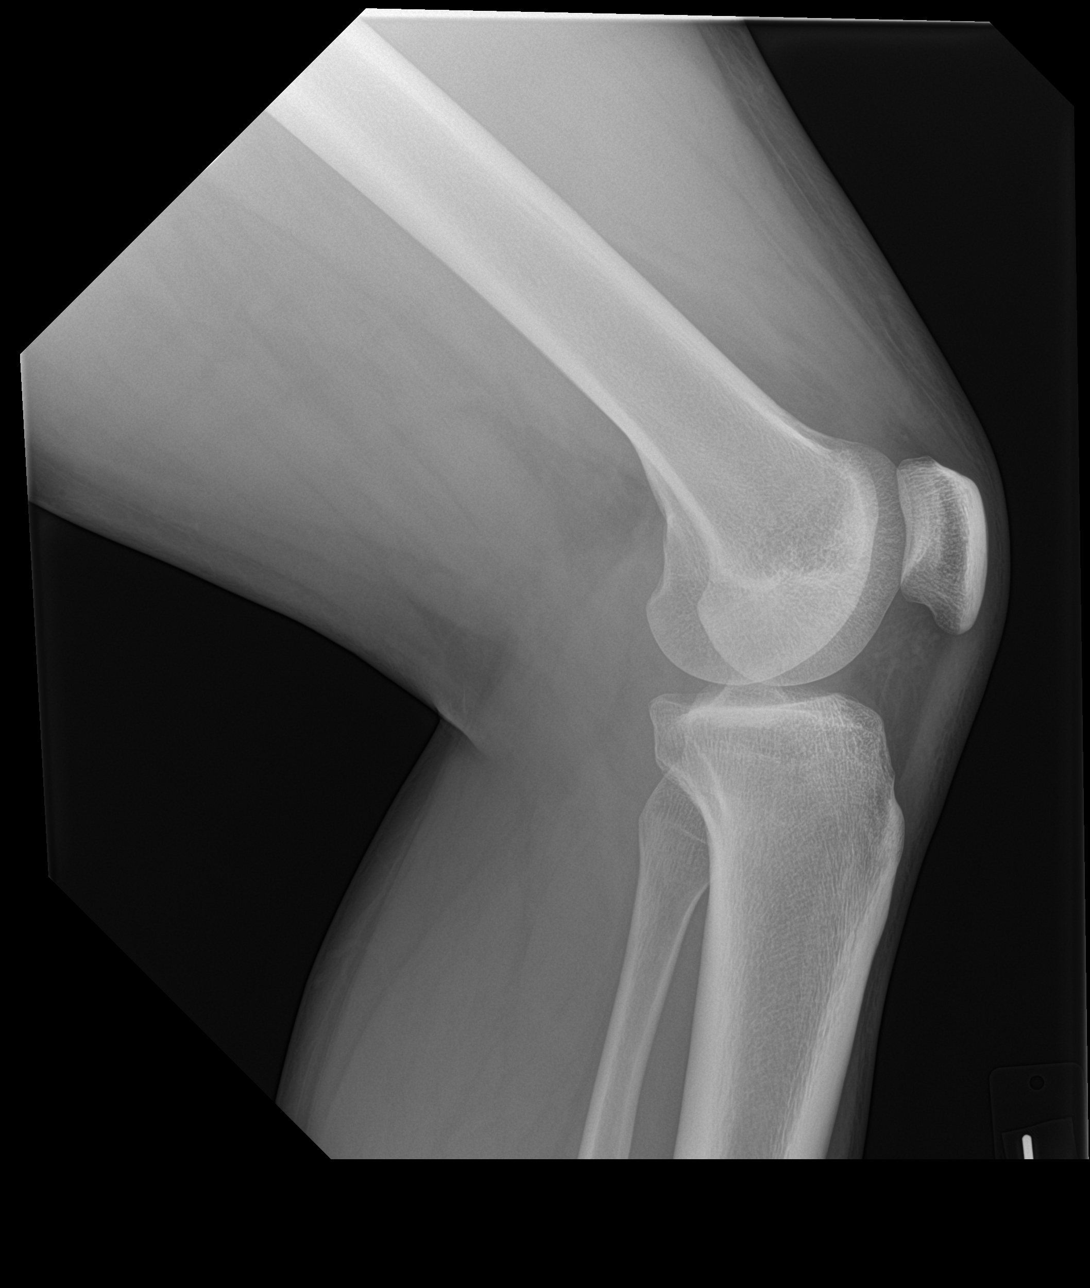

[knee obl (1 of 2)]
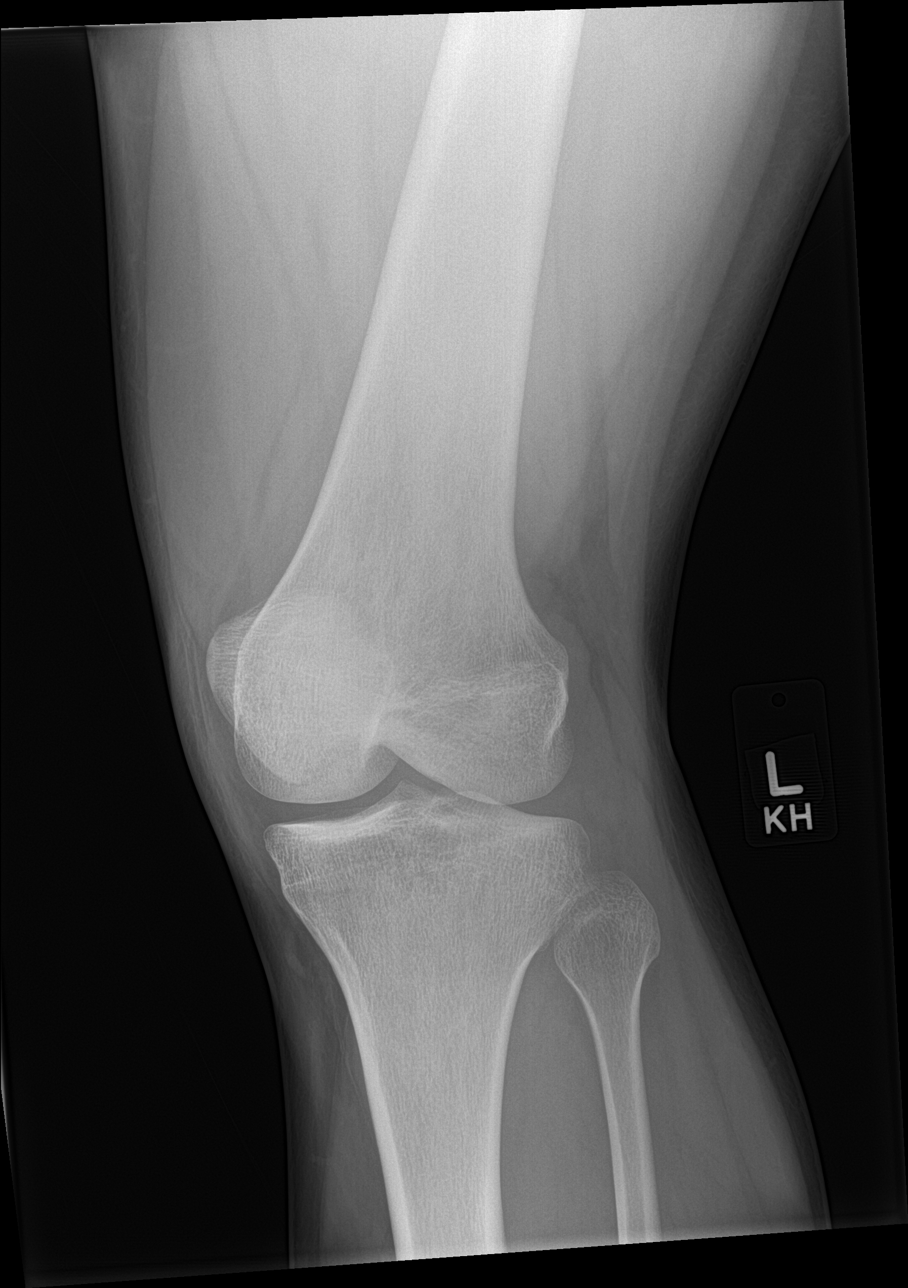

[knee obl (2 of 2)]
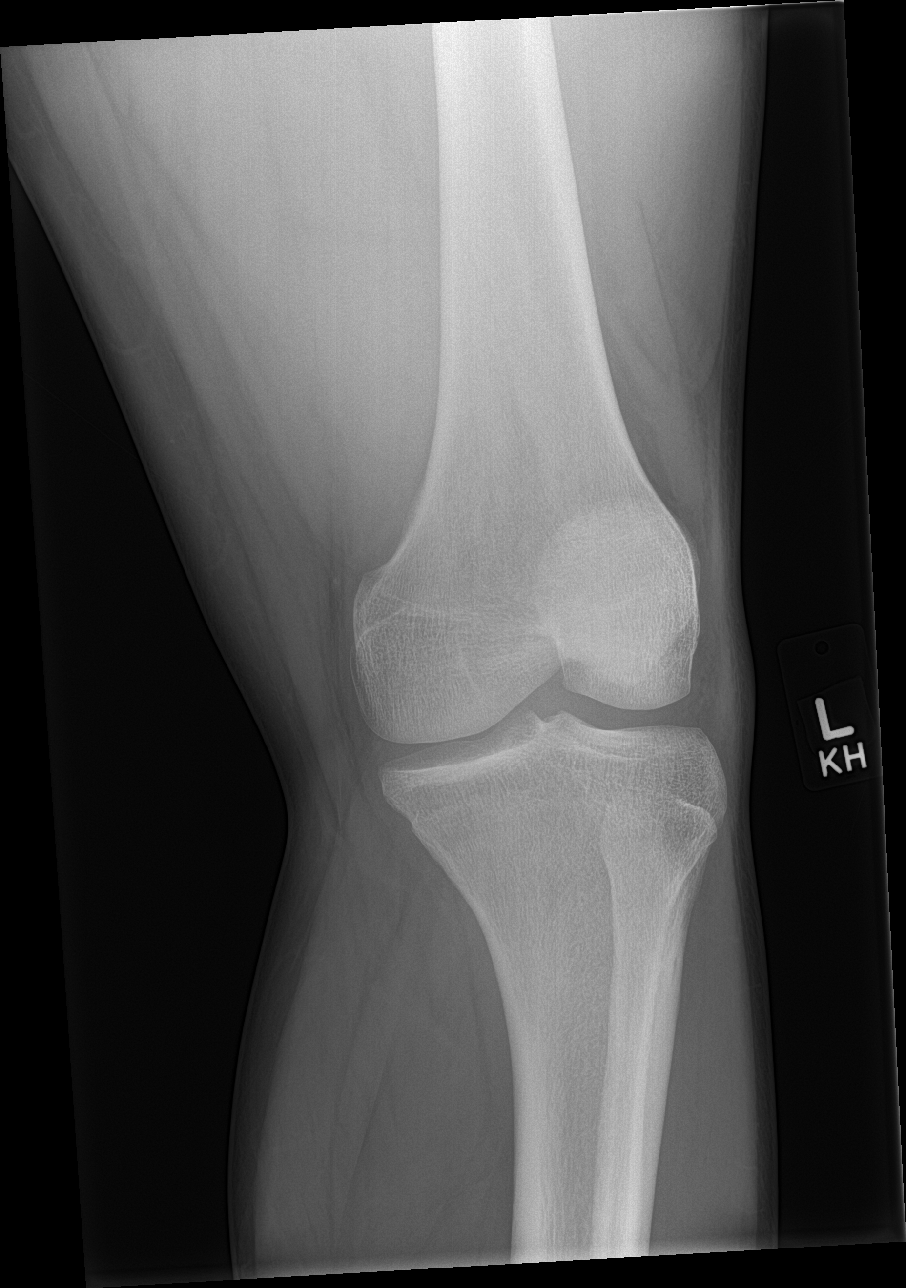

[4 of 4 positions shown; findings below may reference images not displayed]

FINDINGS: No evidence of fracture, dislocation, or joint effusion. No evidence
of arthropathy or other focal bone abnormality. Soft tissues are
unremarkable.
IMPRESSION: Normal examination.

## 2022-04-28 ENCOUNTER — Encounter (HOSPITAL_BASED_OUTPATIENT_CLINIC_OR_DEPARTMENT_OTHER): Payer: Self-pay

## 2022-04-28 ENCOUNTER — Emergency Department (HOSPITAL_COMMUNITY)
Admission: EM | Admit: 2022-04-28 | Discharge: 2022-04-28 | Discharge status: Against Medical Advice | Payer: BC Managed Care – PPO | Attending: Emergency Medicine | Admitting: Emergency Medicine

## 2022-04-28 ENCOUNTER — Emergency Department (HOSPITAL_BASED_OUTPATIENT_CLINIC_OR_DEPARTMENT_OTHER)
Admission: EM | Admit: 2022-04-28 | Discharge: 2022-04-28 | Disposition: A | Discharge status: Home / Self Care | Payer: BC Managed Care – PPO | Source: Home / Self Care | Attending: Emergency Medicine | Admitting: Emergency Medicine

## 2022-04-28 ENCOUNTER — Other Ambulatory Visit: Payer: Self-pay

## 2022-04-28 DIAGNOSIS — R509 Fever, unspecified: Secondary | ICD-10-CM | POA: Insufficient documentation

## 2022-04-28 DIAGNOSIS — U071 COVID-19: Secondary | ICD-10-CM | POA: Insufficient documentation

## 2022-04-28 DIAGNOSIS — Z87891 Personal history of nicotine dependence: Secondary | ICD-10-CM | POA: Insufficient documentation

## 2022-04-28 DIAGNOSIS — M545 Low back pain, unspecified: Secondary | ICD-10-CM | POA: Insufficient documentation

## 2022-04-28 DIAGNOSIS — J45909 Unspecified asthma, uncomplicated: Secondary | ICD-10-CM | POA: Insufficient documentation

## 2022-04-28 DIAGNOSIS — Z5321 Procedure and treatment not carried out due to patient leaving prior to being seen by health care provider: Secondary | ICD-10-CM | POA: Insufficient documentation

## 2022-04-28 DIAGNOSIS — R519 Headache, unspecified: Secondary | ICD-10-CM | POA: Diagnosis not present

## 2022-04-28 LAB — RESP PANEL BY RT-PCR (FLU A&B, COVID) ARPGX2
Influenza A by PCR: NEGATIVE
Influenza B by PCR: NEGATIVE
SARS Coronavirus 2 by RT PCR: POSITIVE — AB

## 2022-04-28 MED ORDER — IBUPROFEN 600 MG PO TABS: 600.0000 mg | ORAL_TABLET | Freq: Four times a day (QID) | ORAL | 0 refills | Status: AC | PRN

## 2022-04-28 MED ORDER — SODIUM CHLORIDE 0.9 % IV BOLUS
1000.0000 mL | Freq: Once | INTRAVENOUS | Status: AC
  Administered 2022-04-28: 1000 mL via INTRAVENOUS

## 2022-04-28 MED ORDER — KETOROLAC TROMETHAMINE 30 MG/ML IJ SOLN
30.0000 mg | Freq: Once | INTRAMUSCULAR | Status: AC
  Administered 2022-04-28: 30 mg via INTRAVENOUS
  Filled 2022-04-28: qty 1

## 2022-04-28 MED ORDER — ACETAMINOPHEN 325 MG PO TABS: 650.0000 mg | ORAL_TABLET | Freq: Four times a day (QID) | ORAL | 0 refills | Status: AC | PRN

## 2022-04-28 NOTE — ED Provider Notes (Signed)
[facility] EMERGENCY DEPARTMENT Provider Note   CSN: [id number] Arrival date & time: 04/28/22  1245     History  Chief Complaint  Patient presents with   Headache    Alex Herring is a 31 y.o. male.  Patient as above with significant medical history as below, including asthma who presents to the ED with complaint of body aches, subjective fever, ha, diarrhea, low back pain Onset of pain last night Subjective fever, no thermometer Started having diarrhea, no brbpr or melena. Last bm was >2 hours ago Having arthralgias/myalgias, no neck pain, no midline back pain No abd pain with the diarrhea or any change in his urination No medications pta No n/v No sick contacts No gait change, no falls, no numbness or weakness to extremities Accompanied by mother      Past Medical History:  Diagnosis Date   Asthma     History reviewed. No pertinent surgical history.   The history is provided by the patient and a parent. No language interpreter was used.  Headache Associated symptoms: back pain, fever and myalgias   Associated symptoms: no abdominal pain, no cough, no nausea, no neck pain, no neck stiffness, no photophobia and no vomiting        Home Medications Prior to Admission medications   Medication Sig Start Date End Date Taking? Authorizing Provider  acetaminophen (TYLENOL) 325 MG tablet Take 2 tablets (650 mg total) by mouth every 6 (six) hours as needed. 04/28/22  Yes Tanda Rockers A, DO  ibuprofen (ADVIL) 600 MG tablet Take 1 tablet (600 mg total) by mouth every 6 (six) hours as needed. 04/28/22  Yes Tanda Rockers A, DO      Allergies    Rubbing alcohol [alcohol]    Review of Systems   Review of Systems  Constitutional:  Positive for fever. Negative for chills.  HENT:  Negative for facial swelling and trouble swallowing.   Eyes:  Negative for photophobia and visual disturbance.  Respiratory:  Negative for cough and shortness of breath.    Cardiovascular:  Negative for chest pain and palpitations.  Gastrointestinal:  Negative for abdominal pain, nausea and vomiting.  Endocrine: Negative for polydipsia and polyuria.  Genitourinary:  Negative for difficulty urinating and hematuria.  Musculoskeletal:  Positive for arthralgias, back pain and myalgias. Negative for gait problem, joint swelling, neck pain and neck stiffness.  Skin:  Negative for pallor and rash.  Neurological:  Positive for headaches. Negative for syncope.  Psychiatric/Behavioral:  Negative for agitation and confusion.     Physical Exam Updated Vital Signs BP (!) 150/93 (BP Location: Right Arm)   Pulse 93   Temp 98.3 F (36.8 C) (Oral)   Resp 18   Ht 5\' 11"  (1.803 m)   Wt 99.8 kg   SpO2 98%   BMI 30.68 kg/m  Physical Exam Vitals and nursing note reviewed.  Constitutional:      General: He is not in acute distress.    Appearance: He is well-developed.  HENT:     Head: Normocephalic and atraumatic.     Right Ear: External ear normal.     Left Ear: External ear normal.     Mouth/Throat:     Mouth: Mucous membranes are moist.  Eyes:     General: No scleral icterus. Neck:     Meningeal: Brudzinski's sign and Kernig's sign absent.  Cardiovascular:     Rate and Rhythm: Normal rate and regular rhythm.     Pulses: Normal pulses.  Heart sounds: Normal heart sounds.  Pulmonary:     Effort: Pulmonary effort is normal. No tachypnea, accessory muscle usage or respiratory distress.     Breath sounds: Normal breath sounds.  Abdominal:     General: Abdomen is flat.     Palpations: Abdomen is soft.     Tenderness: There is no abdominal tenderness. There is no guarding or rebound.  Musculoskeletal:        General: Normal range of motion.     Cervical back: Full passive range of motion without pain and normal range of motion. No rigidity.     Right lower leg: No edema.     Left lower leg: No edema.     Comments: No midline spinous process tenderness to  palpation or percussion, no crepitus or step-off.    LE are NVI No LE edema   Skin:    General: Skin is warm and dry.     Capillary Refill: Capillary refill takes less than 2 seconds.  Neurological:     Mental Status: He is alert and oriented to person, place, and time. Mental status is at baseline.     GCS: GCS eye subscore is 4. GCS verbal subscore is 5. GCS motor subscore is 6.     Cranial Nerves: Cranial nerves 2-12 are intact. No dysarthria or facial asymmetry.     Sensory: Sensation is intact.     Motor: Motor function is intact.     Coordination: Coordination is intact.     Gait: Gait is intact.  Psychiatric:        Mood and Affect: Mood normal.        Behavior: Behavior normal.     ED Results / Procedures / Treatments   Labs (all labs ordered are listed, but only abnormal results are displayed) Labs Reviewed  RESP PANEL BY RT-PCR (FLU A&B, COVID) ARPGX2 - Abnormal; Notable for the following components:      Result Value   SARS Coronavirus 2 by RT PCR POSITIVE (*)    All other components within normal limits  URINALYSIS, ROUTINE W REFLEX MICROSCOPIC    EKG None  Radiology No results found.  Procedures Procedures    Medications Ordered in ED Medications  sodium chloride 0.9 % bolus 1,000 mL (1,000 mLs Intravenous New Bag/Given 04/28/22 0938)  ketorolac (TORADOL) 30 MG/ML injection 30 mg (30 mg Intravenous Given 04/28/22 5732)    ED Course/ Medical Decision Making/ A&P                           Medical Decision Making Amount and/or Complexity of Data Reviewed Labs: ordered.  Risk OTC drugs. Prescription drug management.   This patient presents to the ED with chief complaint(s) of arthralgias, myalgias, back pain, diarrhea with pertinent past medical history of above which further complicates the presenting complaint. The complaint involves an extensive differential diagnosis and also carries with it a high risk of complications and morbidity.     Differential diagnosis includes but is not exclusive to musculoskeletal back pain, renal colic, urinary tract infection, pyelonephritis, intra-abdominal causes of back pain, aortic aneurysm or dissection, cauda equina syndrome, sciatica, lumbar disc disease, thoracic disc disease, etc. Differential diagnosis for adult fever includes but is not exclusive to community-acquired pneumonia, urinary tract infection, acute cholecystitis, viral syndrome, cellulitis, tick bourne disease,  decubitus ulcer, necrotizing fasciitis, meningitis, encephalitis, influenza, etc.   . Serious etiologies were considered.   The initial plan  is to RVP, ivf, toradol, ua   Additional history obtained: Additional history obtained from family Records reviewed  prior ed visits, prior labs/imaging Of note pt initially presented to [facility] this am but left 2/2 wait time   Independent labs interpretation:  The following labs were independently interpreted:  Covid +tive, he was vaccinated at least x2 per pt  Independent visualization of imaging:  Cardiac monitoring was reviewed and interpreted by myself which shows na  Treatment and Reassessment: Ivf Toradol >> improved, no diarrhea while in the ED Body aches and ha improved   Consultation: - Consulted or discussed management/test interpretation w/ external professional: na  Consideration for admission or further workup: Admission was considered   Pt feeling better, he unfortunately has covid 19, likely source of his complaints today. Neuro non focal. He was vaccinated. He is not interested in paxlovid. Discussed supportive care at home, isolation/quarantine, rted if worse, work note provided.   The patient improved significantly and was discharged in stable condition. Detailed discussions were had with the patient regarding current findings, and need for close f/u with PCP or on call doctor. The patient has been instructed to return immediately if the  symptoms worsen in any way for re-evaluation. Patient verbalized understanding and is in agreement with current care plan. All questions answered prior to discharge.    Social Determinants of health: Social History   Tobacco Use   Smoking status: Former   Smokeless tobacco: Never  Substance Use Topics   Alcohol use: Yes    Comment: occasionally, beer & liquor   Drug use: No            Final Clinical Impression(s) / ED Diagnoses Final diagnoses:  COVID-19    Rx / DC Orders ED Discharge Orders          Ordered    ibuprofen (ADVIL) 600 MG tablet  Every 6 hours PRN        04/28/22 1028    acetaminophen (TYLENOL) 325 MG tablet  Every 6 hours PRN        04/28/22 1028              Tanda Rockers A, DO 04/28/22 1030
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
## 2022-04-28 NOTE — ED Notes (Signed)
Pt states he is just going to leave because the wait time is too long

## 2022-04-28 NOTE — Discharge Instructions (Addendum)
It was a pleasure caring for you today in the emergency department. ° °Please return to the emergency department for any worsening or worrisome symptoms. ° ° °

## 2022-04-28 NOTE — ED Triage Notes (Signed)
Patient reports fever with low back pain and headache onset yesterday , denies dysuria or injury.

## 2022-06-26 ENCOUNTER — Emergency Department (HOSPITAL_BASED_OUTPATIENT_CLINIC_OR_DEPARTMENT_OTHER): Payer: BC Managed Care – PPO

## 2022-06-26 ENCOUNTER — Other Ambulatory Visit: Payer: Self-pay

## 2022-06-26 ENCOUNTER — Emergency Department (HOSPITAL_BASED_OUTPATIENT_CLINIC_OR_DEPARTMENT_OTHER)
Admission: EM | Admit: 2022-06-26 | Discharge: 2022-06-26 | Disposition: A | Discharge status: Home / Self Care | Payer: BC Managed Care – PPO | Attending: Emergency Medicine | Admitting: Emergency Medicine

## 2022-06-26 DIAGNOSIS — M549 Dorsalgia, unspecified: Secondary | ICD-10-CM | POA: Insufficient documentation

## 2022-06-26 DIAGNOSIS — Y92410 Unspecified street and highway as the place of occurrence of the external cause: Secondary | ICD-10-CM | POA: Diagnosis not present

## 2022-06-26 DIAGNOSIS — R519 Headache, unspecified: Secondary | ICD-10-CM | POA: Insufficient documentation

## 2022-06-26 DIAGNOSIS — M542 Cervicalgia: Secondary | ICD-10-CM | POA: Insufficient documentation

## 2022-06-26 MED ORDER — NAPROXEN 500 MG PO TABS: 500.0000 mg | ORAL_TABLET | Freq: Two times a day (BID) | ORAL | 0 refills | Status: AC

## 2022-06-26 MED ORDER — CYCLOBENZAPRINE HCL 10 MG PO TABS: 10.0000 mg | ORAL_TABLET | Freq: Two times a day (BID) | ORAL | 0 refills | Status: AC | PRN

## 2022-06-26 MED ORDER — IBUPROFEN 800 MG PO TABS
800.0000 mg | ORAL_TABLET | Freq: Once | ORAL | Status: AC
  Administered 2022-06-26: 800 mg via ORAL
  Filled 2022-06-26: qty 1
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
